# Patient Record
Sex: Female | Born: 2000 | Race: White | Hispanic: No | Smoking: Current every day smoker
Health system: Southern US, Community
[De-identification: ages and names within clinical notes are randomized; demographics above are authoritative.]

## PROBLEM LIST (undated history)

## (undated) DIAGNOSIS — J45909 Unspecified asthma, uncomplicated: Secondary | ICD-10-CM

## (undated) HISTORY — PX: OTHER SURGICAL HISTORY: SHX169

---
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
## 2022-02-19 ENCOUNTER — Telehealth: Payer: Self-pay | Admitting: Obstetrics and Gynecology

## 2022-02-19 DIAGNOSIS — Z3041 Encounter for surveillance of contraceptive pills: Secondary | ICD-10-CM

## 2022-02-20 ENCOUNTER — Other Ambulatory Visit: Payer: Self-pay

## 2022-02-20 NOTE — Telephone Encounter (Signed)
I contacted patient via phone and left message for patient to call back to confirm scheduled annual exam with Dr. Valentino Saxon.

## 2022-02-24 ENCOUNTER — Ambulatory Visit (INDEPENDENT_AMBULATORY_CARE_PROVIDER_SITE_OTHER): Payer: Medicaid Other | Admitting: Gastroenterology

## 2022-02-24 ENCOUNTER — Encounter: Payer: Self-pay | Admitting: Gastroenterology

## 2022-02-24 VITALS — BP 121/81 | HR 81 | Temp 98.9°F | Ht 65.07 in | Wt 118.0 lb

## 2022-02-24 DIAGNOSIS — K640 First degree hemorrhoids: Secondary | ICD-10-CM

## 2022-02-24 DIAGNOSIS — K644 Residual hemorrhoidal skin tags: Secondary | ICD-10-CM | POA: Diagnosis not present

## 2022-02-24 MED ORDER — NORGESTIMATE-ETH ESTRADIOL 0.25-35 MG-MCG PO TABS: 1.0000 | ORAL_TABLET | Freq: Every day | ORAL | 0 refills | Status: DC

## 2022-02-24 NOTE — Telephone Encounter (Signed)
Patient is scheduled for 04/08/22 with Dr. Valentino Saxon

## 2022-02-24 NOTE — Telephone Encounter (Signed)
Please advise refill? 

## 2022-02-24 NOTE — Addendum Note (Signed)
Addended by: Georgiana Shore R on: 02/24/2022 03:08 PM   Modules accepted: Orders

## 2022-02-24 NOTE — Progress Notes (Signed)
Arlyss Repress, MD [address]  [address]  [place], [place] [address]  Main: [phone]  Fax: [phone]    Gastroenterology Consultation  Referring Provider:     Pediatrics, [facility] Primary Care Physician:  Ardyth Gal, MD Primary Gastroenterologist:  Dr. Arlyss Repress Reason for Consultation: Symptomatic hemorrhoids and perianal skin tag        HPI:   Kristina Alexander is a 21 y.o. female referred by Dr. Ardyth Gal, MD  for consultation & management of symptomatic hemorrhoids and perianal skin tag.  Patient reports that approximately the last 3 to 4 years, she has been experiencing intermittent and symptoms including rectal pressure, swelling, discomfort, itching and pain.  She is mostly worried about perianal skin tag that has developed about 2 to 40 years ago when she had severe symptomatic external hemorrhoids which have brought her to the ER.  She was having severe constipation for about 2 weeks.  Since then, she has been very careful keeping her bowels regular.  She denies any constipation or diarrhea.  She denies any rectal bleeding.  She denies any other GI symptoms.  She does not smoke or drink alcohol  NSAIDs: None  Antiplts/Anticoagulants/Anti thrombotics: None  GI Procedures: None She denies family history of GI malignancy  Past Medical History:  Diagnosis Date   Asthma     Past Surgical History:  Procedure Laterality Date   no surgerical history      Current Outpatient Medications:    norgestimate-ethinyl estradiol (SPRINTEC 28) 0.25-35 MG-MCG tablet, Take 1 tablet by mouth daily., Disp: 84 tablet, Rfl: 0   sertraline (ZOLOFT) 25 MG tablet, Take 1 tablet by mouth daily., Disp: , Rfl:    Family History  Problem Relation Age of Onset   Meniere's disease Mother    Healthy Father    Healthy Maternal Grandmother      Social History   Tobacco Use   Smoking status: Never   Smokeless tobacco: Never  Vaping Use   Vaping Use: Every day    Substances: Nicotine  Substance Use Topics   Alcohol use: Yes    Comment: 2 glasses 2 to 3 times a week   Drug use: Yes    Types: Marijuana    Allergies as of 02/24/2022 - Review Complete 02/24/2022  Allergen Reaction Noted   Latex  12/18/2016    Review of Systems:    All systems reviewed and negative except where noted in HPI.   Physical Exam:  BP 121/81 (BP Location: Left Arm, Patient Position: Sitting, Cuff Size: Normal)   Pulse 81   Temp 98.9 F (37.2 C) (Oral)   Ht 5' 5.07" (1.653 m)   Wt 118 lb (53.5 kg)   BMI 19.59 kg/m  No LMP recorded. (Menstrual status: Oral contraceptives).  General:   Alert,  Well-developed, well-nourished, pleasant and cooperative in NAD Head:  Normocephalic and atraumatic. Eyes:  Sclera clear, no icterus.   Conjunctiva pink. Ears:  Normal auditory acuity. Nose:  No deformity, discharge, or lesions. Mouth:  No deformity or lesions,oropharynx pink & moist. Neck:  Supple; no masses or thyromegaly. Lungs:  Respirations even and unlabored.  Clear throughout to auscultation.   No wheezes, crackles, or rhonchi. No acute distress. Heart:  Regular rate and rhythm; no murmurs, clicks, rubs, or gallops. Abdomen:  Normal bowel sounds. Soft, non-tender and non-distended without masses, hepatosplenomegaly or hernias noted.  No guarding or rebound tenderness.   Rectal: Very small perianal skin tag Msk:  Symmetrical without gross deformities. Good, equal movement & strength bilaterally. Pulses:  Normal pulses noted. Extremities:  No clubbing or edema.  No cyanosis. Neurologic:  Alert and oriented x3;  grossly normal neurologically. Skin:  Intact without significant lesions or rashes. No jaundice. Psych:  Alert and cooperative. Normal mood and affect.  Imaging Studies: No abdominal imaging  Assessment and Plan:   Kristina Alexander is a 21 y.o. pleasant female with perianal skin tag and grade 1 symptomatic external hemorrhoids.  Perianal skin  tag Advised patient that I do not treat perianal skin tag and given the size, I do not recommend surgical removal  Grade 1 external hemorrhoids Discussed about outpatient hemorrhoid ligation, procedure, risks and benefits Information provided Patient deferred hemorrhoid ligation today Advised about over-the-counter hemorrhoidal remedies as needed Continue high-fiber diet with adequate intake of water and keep bowels regular   Follow up as needed   Cephas Darby, MD

## 2022-04-04 NOTE — Progress Notes (Signed)
GYNECOLOGY ANNUAL PHYSICAL EXAM PROGRESS NOTE  Subjective:    Kristina Alexander is a 22 y.o. G3P0010 female who presents for an annual exam.  The patient is sexually active. The patient participates in regular exercise: yes. Has the patient ever been transfused or tattooed?: yes. The patient reports that there is not domestic violence in her life.   The patient has the following complaints today:  Patient has complaints of small breasts bilaterally, right slightly smaller than left. Feels that something went wrong during her breast development.  Feels that the pectoral muscle did not develop, has no "lift".  Currently notes wearing an A cup which is still not fitting.    Menstrual History: Menarche age: 26 Patient's last menstrual period was 03/09/2022. Period Cycle (Days): 28 Period Duration (Days): 5-6 Period Pattern: Regular Menstrual Flow: Moderate Menstrual Control: Maxi pad, Tampon Menstrual Control Change Freq (Hours): 2-3 Dysmenorrhea: (!) Moderate Dysmenorrhea Symptoms: Cramping, Nausea   Gynecologic History:  Contraception: OCP (estrogen/progesterone) History of STI's: Denies     Upstream - 04/08/22 1059       Pregnancy Intention Screening   Does the patient want to become pregnant in the next year? No    Does the patient's partner want to become pregnant in the next year? No    Would the patient like to discuss contraceptive options today? No      Contraception Wrap Up   Current Method Oral Contraceptive    End Method Oral Contraceptive    How was the end contraceptive method provided? Prescription            The pregnancy intention screening data noted above was reviewed. Potential methods of contraception were discussed. The patient elected to proceed with Oral Contraceptive.   OB History  Gravida Para Term Preterm AB Living  1 0 0 0 1 0  SAB IAB Ectopic Multiple Live Births  0 1 0 0 0    # Outcome Date GA Lbr Len/2nd Weight Sex Delivery Anes  PTL Lv  1 IAB 2021            Past Medical History:  Diagnosis Date   Asthma     Past Surgical History:  Procedure Laterality Date   no surgerical history      Family History  Problem Relation Age of Onset   Meniere's disease Mother    Healthy Father    Healthy Maternal Grandmother     Social History   Socioeconomic History   Marital status: Single    Spouse name: Not on file   Number of children: Not on file   Years of education: Not on file   Highest education level: Not on file  Occupational History   Not on file  Tobacco Use   Smoking status: Never   Smokeless tobacco: Never  Vaping Use   Vaping Use: Every day   Substances: Nicotine  Substance and Sexual Activity   Alcohol use: Yes    Comment: 2 glasses 2 to 3 times a week   Drug use: Yes    Types: Marijuana   Sexual activity: Yes    Birth control/protection: Condom  Other Topics Concern   Not on file  Social History Narrative   ** Merged History Encounter **       Social Determinants of Health   Financial Resource Strain: Not on file  Food Insecurity: Not on file  Transportation Needs: Not on file  Physical Activity: Not on file  Stress: Not on  file  Social Connections: Not on file  Intimate Partner Violence: Not on file    Current Outpatient Medications on File Prior to Visit  Medication Sig Dispense Refill   norgestimate-ethinyl estradiol ([place] 28) 0.25-35 MG-MCG tablet Take 1 tablet by mouth daily. 84 tablet 0   sertraline (ZOLOFT) 25 MG tablet Take 1 tablet by mouth daily.     No current facility-administered medications on file prior to visit.    Allergies  Allergen Reactions   Latex      Review of Systems Constitutional: negative for chills, fatigue, fevers and sweats Eyes: negative for irritation, redness and visual disturbance Ears, nose, mouth, throat, and face: negative for hearing loss, nasal congestion, snoring and tinnitus Respiratory: negative for asthma, cough,  sputum Cardiovascular: negative for chest pain, dyspnea, exertional chest pressure/discomfort, irregular heart beat, palpitations and syncope Gastrointestinal: negative for abdominal pain, change in bowel habits, nausea and vomiting Genitourinary: negative for abnormal menstrual periods, genital lesions, sexual problems and vaginal discharge, dysuria and urinary incontinence Integument/breast: negative for breast lump, breast tenderness and nipple discharge Hematologic/lymphatic: negative for bleeding and easy bruising Musculoskeletal:negative for back pain and muscle weakness Neurological: negative for dizziness, headaches, vertigo and weakness Endocrine: negative for diabetic symptoms including polydipsia, polyuria and skin dryness Allergic/Immunologic: negative for hay fever and urticaria      Objective:  Blood pressure 127/76, pulse 100, resp. rate 16, height 5\' 6"  (1.676 m), weight 116 lb 12.8 oz (53 kg), last menstrual period 03/09/2022. Body mass index is 18.85 kg/m.  General Appearance:    Alert, cooperative, no distress, appears stated age  Head:    Normocephalic, without obvious abnormality, atraumatic  Eyes:    PERRL, conjunctiva/corneas clear, EOM's intact, both eyes  Ears:    Normal external ear canals, both ears  Nose:   Nares normal, septum midline, mucosa normal, no drainage or sinus tenderness  Throat:   Lips, mucosa, and tongue normal; teeth and gums normal  Neck:   Supple, symmetrical, trachea midline, no adenopathy; thyroid: no enlargement/tenderness/nodules; no carotid bruit or JVD  Back:     Symmetric, no curvature, ROM normal, no CVA tenderness  Lungs:     Clear to auscultation bilaterally, respirations unlabored  Chest Wall:    No tenderness or deformity   Heart:    Regular rate and rhythm, S1 and S2 normal, no murmur, rub or gallop  Breast Exam:    No tenderness, masses, or nipple abnormality  Abdomen:     Soft, non-tender, bowel sounds active all four quadrants,  no masses, no organomegaly.    Genitalia:    Pelvic:deferred today.  Not indicated.   Rectal:    Declined.   Extremities:   Extremities normal, atraumatic, no cyanosis or edema  Pulses:   2+ and symmetric all extremities  Skin:   Skin color, texture, turgor normal, no rashes or lesions  Lymph nodes:   Cervical, supraclavicular, and axillary nodes normal  Neurologic:   CNII-XII intact, normal strength, sensation and reflexes throughout   .  Labs:  Lab Results  Component Value Date   WBC 6.1 08/15/2020   HGB 12.3 08/15/2020   HCT 38.1 08/15/2020   MCV 91 08/15/2020   PLT 295 08/15/2020    Lab Results  Component Value Date   CREATININE 0.73 08/15/2020   BUN 6 08/15/2020   NA 144 08/15/2020   K 4.4 08/15/2020   CL 105 08/15/2020   CO2 23 08/15/2020    Lab Results  Component Value Date  ALT 8 08/15/2020   AST 16 08/15/2020   ALKPHOS 69 08/15/2020   BILITOT 0.3 08/15/2020    No results found for: "TSH"   Assessment:   1. Encounter for well woman exam with routine gynecological exam   2. Surveillance for birth control, oral contraceptives   3. Small breast      Plan:  Blood tests: None ordered. Had labs performed in [place].   Breast self exam technique reviewed and patient encouraged to perform self-exam monthly. Contraception: condoms and OCP (estrogen/progesterone).  Refill previously given.  Discussed healthy lifestyle modifications.  Pap smear  : Not age appropriate . COVID vaccination status: Declines Flu vaccine: declines Small breasts, desires consultation, will refer to Plastics. Discussed pectoral exercises to engage that may help to enhance breast tissue and muscle.  Discussed HPV vaccine, unsure if she received in childhood. Relocated from [place] in 2022.  Recently had STD screening in [place]. Reviewed in [place].  Follow up in 1 year for annual exam   Rubie Maid, MD [facility]

## 2022-04-08 ENCOUNTER — Ambulatory Visit (INDEPENDENT_AMBULATORY_CARE_PROVIDER_SITE_OTHER): Payer: Medicaid Other | Admitting: Obstetrics and Gynecology

## 2022-04-08 ENCOUNTER — Encounter: Payer: Self-pay | Admitting: Obstetrics and Gynecology

## 2022-04-08 VITALS — BP 127/76 | HR 100 | Resp 16 | Ht 66.0 in | Wt 116.8 lb

## 2022-04-08 DIAGNOSIS — Z1159 Encounter for screening for other viral diseases: Secondary | ICD-10-CM

## 2022-04-08 DIAGNOSIS — Z01419 Encounter for gynecological examination (general) (routine) without abnormal findings: Secondary | ICD-10-CM | POA: Diagnosis not present

## 2022-04-08 DIAGNOSIS — Z3041 Encounter for surveillance of contraceptive pills: Secondary | ICD-10-CM

## 2022-04-08 DIAGNOSIS — Z114 Encounter for screening for human immunodeficiency virus [HIV]: Secondary | ICD-10-CM

## 2022-04-08 DIAGNOSIS — N6489 Other specified disorders of breast: Secondary | ICD-10-CM

## 2022-04-08 DIAGNOSIS — Z113 Encounter for screening for infections with a predominantly sexual mode of transmission: Secondary | ICD-10-CM

## 2022-04-08 DIAGNOSIS — Z23 Encounter for immunization: Secondary | ICD-10-CM

## 2022-04-08 MED ORDER — NORGESTIMATE-ETH ESTRADIOL 0.25-35 MG-MCG PO TABS: 1.0000 | ORAL_TABLET | Freq: Every day | ORAL | 3 refills | Status: AC

## 2022-05-02 ENCOUNTER — Ambulatory Visit (INDEPENDENT_AMBULATORY_CARE_PROVIDER_SITE_OTHER): Payer: Medicaid Other

## 2022-05-02 VITALS — BP 129/79 | HR 99 | Ht 66.0 in | Wt 120.3 lb

## 2022-05-02 DIAGNOSIS — R399 Unspecified symptoms and signs involving the genitourinary system: Secondary | ICD-10-CM | POA: Diagnosis not present

## 2022-05-02 DIAGNOSIS — R350 Frequency of micturition: Secondary | ICD-10-CM | POA: Diagnosis not present

## 2022-05-02 DIAGNOSIS — R82998 Other abnormal findings in urine: Secondary | ICD-10-CM

## 2022-05-02 LAB — POCT URINALYSIS DIPSTICK
Bilirubin, UA: NEGATIVE
Glucose, UA: NEGATIVE
Ketones, UA: NEGATIVE
Nitrite, UA: NEGATIVE
Protein, UA: NEGATIVE
Spec Grav, UA: 1.01 (ref 1.010–1.025)
Urobilinogen, UA: 0.2 E.U./dL
pH, UA: 6 (ref 5.0–8.0)

## 2022-05-02 MED ORDER — NITROFURANTOIN MONOHYD MACRO 100 MG PO CAPS: 100.0000 mg | ORAL_CAPSULE | Freq: Two times a day (BID) | ORAL | 0 refills | Status: DC

## 2022-05-02 NOTE — Progress Notes (Signed)
    NURSE VISIT NOTE  Subjective:    Patient ID: Kristina Alexander, female    DOB: 01/02/2001, 22 y.o.   MRN: [id number]       HPI  Patient is a 22 y.o. G4P0010 female G4P0010 female who presents for dysuria, urinary frequency, urinary urgency, and throbbing  for 2 weeks.   Patient does have a history of recurrent UTI.  Patient does not have a history of pyelonephritis.    Objective:    Ht 5' 6"$  (1.676 m)   Wt 120 lb 4.8 oz (54.6 kg)   LMP 04/08/2022 (Approximate)   BMI 19.42 kg/m    Lab Review  Results for orders placed or performed in visit on 05/02/22  POCT urinalysis dipstick  Result Value Ref Range   Color, UA yellow    Clarity, UA clear    Glucose, UA Negative Negative   Bilirubin, UA neg    Ketones, UA neg    Spec Grav, UA 1.010 1.010 - 1.025   Blood, UA small    pH, UA 6.0 5.0 - 8.0   Protein, UA Negative Negative   Urobilinogen, UA 0.2 0.2 or 1.0 E.U./dL   Nitrite, UA neg    Leukocytes, UA Large (3+) (A) Negative   Appearance     Odor      Assessment:   1. UTI symptoms   2. Leukocytes in urine   3. Urinary frequency      Plan:   Urine Culture Sent. Treatment  Macrobid 100 mg PO BID for 7 days. Maintain adequate hydration.  May use AZO OTC prn.  Follow up if symptoms worsen or fail to improve as anticipated, and as needed.    Marykay Lex, CMA

## 2022-05-07 ENCOUNTER — Institutional Professional Consult (permissible substitution): Payer: Medicaid Other | Admitting: Plastic Surgery

## 2022-05-07 LAB — URINE CULTURE

## 2022-05-14 ENCOUNTER — Ambulatory Visit (INDEPENDENT_AMBULATORY_CARE_PROVIDER_SITE_OTHER): Payer: Self-pay | Admitting: Plastic Surgery

## 2022-05-14 ENCOUNTER — Encounter: Payer: Self-pay | Admitting: Plastic Surgery

## 2022-05-14 VITALS — BP 129/76 | HR 90 | Ht 65.0 in | Wt 122.2 lb

## 2022-05-14 DIAGNOSIS — N6489 Other specified disorders of breast: Secondary | ICD-10-CM

## 2022-05-14 NOTE — Progress Notes (Signed)
   Referring Provider Rubie Maid, [place],  [place] [address]   CC:  Chief Complaint  Patient presents with   Advice Only      Kristina Alexander is an 22 y.o. female.  HPI: Kristina Alexander is a 22 year old female who presents today for evaluation of small breast.  The patient states that she had very little breast development at the beginning of puberty and that it never quite continued.  She she states that this is very depressing to her and that she often has people make unkind comments regarding her breast development.  She was wondering if she has a congenital defect which might be treated by breast augmentation.  Allergies  Allergen Reactions   Latex     Outpatient Encounter Medications as of 05/14/2022  Medication Sig   norgestimate-ethinyl estradiol (SPRINTEC 28) 0.25-35 MG-MCG tablet Take 1 tablet by mouth daily.   sertraline (ZOLOFT) 50 MG tablet Take 1 tablet by mouth daily.   [DISCONTINUED] nitrofurantoin, macrocrystal-monohydrate, (MACROBID) 100 MG capsule Take 1 capsule (100 mg total) by mouth 2 (two) times daily. (Patient not taking: Reported on 05/14/2022)   No facility-administered encounter medications on file as of 05/14/2022.     Past Medical History:  Diagnosis Date   Asthma     Past Surgical History:  Procedure Laterality Date   no surgerical history      Family History  Problem Relation Age of Onset   Meniere's disease Mother    Healthy Father    Healthy Maternal Grandmother     Social History   Social History Narrative   ** Merged History Encounter **         Review of Systems General: Denies fevers, chills, weight loss CV: Denies chest pain, shortness of breath, palpitations Breast: Bilateral small breast  Physical Exam    05/14/2022    3:29 PM 05/02/2022    2:40 PM 04/08/2022   10:57 AM  Vitals with BMI  Height 5' 5"$  5' 6"$  5' 6"$   Weight 122 lbs 3 oz 120 lbs 5 oz 116 lbs 13 oz  BMI 20.34 0000000 [id number]  Systolic [id number]  [id number]  Diastolic 76 79 76  Pulse 90 99 100    General:  No acute distress,  Alert and oriented, Non-Toxic, Normal speech and affect Upper extremities: Patient has normal appearing upper extremities with normal fingers and normal grip strength bilaterally Chest wall: The patient has normal-appearing pectoralis muscles bilaterally with equal contraction bilaterally Breast: The patient has very small breasts bilaterally however she has normal breast tissue with no obvious abnormalities there is no tuberous component to either breast. Mammogram: Not applicable Assessment/Plan Breasts: The patient has small breast bilaterally.  She would likely obtain a very nice result with bilateral breast augmentation however she has no congenital component to this that I can ascertain on physical exam.  Any augmentation would be an aesthetic procedure.  Will provide her with a quote for bilateral breast augmentation with both silicone and saline implants.  We did discuss the fact that if she was to choose to move forward that she would not be a candidate for breast augmentation while she was smoking.  She may follow-up with me as needed.  [name] 05/14/2022, 3:45 PM

## 2022-05-15 ENCOUNTER — Encounter: Payer: Self-pay | Admitting: *Deleted

## 2023-05-14 ENCOUNTER — Other Ambulatory Visit: Payer: Self-pay | Admitting: Obstetrics and Gynecology

## 2023-05-14 DIAGNOSIS — Z3041 Encounter for surveillance of contraceptive pills: Secondary | ICD-10-CM

## 2023-09-02 ENCOUNTER — Emergency Department
Admission: EM | Admit: 2023-09-02 | Discharge: 2023-09-02 | Disposition: A | Discharge status: Home / Self Care | Attending: Emergency Medicine | Admitting: Emergency Medicine

## 2023-09-02 ENCOUNTER — Encounter: Payer: Self-pay | Admitting: Emergency Medicine

## 2023-09-02 DIAGNOSIS — J45909 Unspecified asthma, uncomplicated: Secondary | ICD-10-CM | POA: Diagnosis not present

## 2023-09-02 DIAGNOSIS — Z23 Encounter for immunization: Secondary | ICD-10-CM | POA: Insufficient documentation

## 2023-09-02 DIAGNOSIS — S41111A Laceration without foreign body of right upper arm, initial encounter: Secondary | ICD-10-CM

## 2023-09-02 DIAGNOSIS — W228XXA Striking against or struck by other objects, initial encounter: Secondary | ICD-10-CM | POA: Diagnosis not present

## 2023-09-02 DIAGNOSIS — S61411A Laceration without foreign body of right hand, initial encounter: Secondary | ICD-10-CM | POA: Diagnosis not present

## 2023-09-02 DIAGNOSIS — S51811A Laceration without foreign body of right forearm, initial encounter: Secondary | ICD-10-CM | POA: Insufficient documentation

## 2023-09-02 DIAGNOSIS — S6991XA Unspecified injury of right wrist, hand and finger(s), initial encounter: Secondary | ICD-10-CM | POA: Diagnosis present

## 2023-09-02 MED ORDER — LIDOCAINE-EPINEPHRINE (PF) 2 %-1:200000 IJ SOLN
20.0000 mL | Freq: Once | INTRAMUSCULAR | Status: AC
  Administered 2023-09-02: 20 mL via INTRADERMAL
  Filled 2023-09-02: qty 20

## 2023-09-02 MED ORDER — BACITRACIN ZINC 500 UNIT/GM EX OINT
TOPICAL_OINTMENT | Freq: Once | CUTANEOUS | Status: AC
  Filled 2023-09-02: qty 0.9

## 2023-09-02 MED ORDER — TETANUS-DIPHTH-ACELL PERTUSSIS 5-2.5-18.5 LF-MCG/0.5 IM SUSY
0.5000 mL | PREFILLED_SYRINGE | Freq: Once | INTRAMUSCULAR | Status: AC
  Administered 2023-09-02: 0.5 mL via INTRAMUSCULAR
  Filled 2023-09-02: qty 0.5

## 2023-09-02 NOTE — ED Triage Notes (Signed)
 Pt punched a window approx 1 hour prior to arrival, resulting in an approx 1 inch laceration to the right forearm. Abrasions noted to the top of right hand and on 5th digit. Bleeding controlled, no glass present. Wounds cleaned and new dry bandage applied.

## 2023-09-02 NOTE — Discharge Instructions (Addendum)
 You may alternate Tylenol 1000 mg every 6 hours as needed for pain, fever and Ibuprofen 800 mg every 6-8 hours as needed for pain, fever.  Please take Ibuprofen with food.  Do not take more than 4000 mg of Tylenol (acetaminophen) in a 24 hour period.  You may clean your wound gently daily with soap and warm water and then pat dry.  Your sutures are absorbable and will come out on their own in the next 2 to 3 weeks.  If they have not come out by the end of 3 weeks, you may return to the emergency department, urgent care or your PCP for removal.  Please do not apply over-the-counter antibiotic ointment as this can breakdown absorbable sutures too quickly.  Also many people have reactions to these ointments and can delay wound healing rather than help it.  Please keep your wound clean and dry.  You may leave it uncovered or cover it with a dressing daily.  All wounds have the risk of becoming infected.  We do everything sterilely to try to prevent this and clean the wound with saline, Betadine or chlorhexidine.  If your wound becomes red, warm, draining pus, has a foul odor or you have a fever of 100.4 or higher, please return to the emergency department.  All wounds also have the risk of scarring.  I recommend once your wound has completely healed and your sutures have come out or have been removed that you make sure to keep sunscreen on your scars every day for at least the next year.  You may also use over-the-counter Mederma once the wound is healed which can help with scar formation.

## 2023-09-02 NOTE — ED Provider Notes (Addendum)
 [facility] Provider Note    Event Date/Time   First MD Initiated Contact with Patient 09/02/23 0304     (approximate)   History   Laceration   HPI  Kristina Alexander is a 23 y.o. female right-hand-dominant with history of asthma, alcohol use disorder, depression, anorexia nervosa who presents to the emergency department with lacerations to the right dorsal hand and forearm after she got angry and punched a small window tonight.  She denies any bony pain.  Normal range of motion in her fingers, wrist, elbow.  Denies that this was an attempt to harm herself.  Unsure of her last tetanus vaccine.  Denies any other injury.  No numbness, tingling.   History provided by patient.    Past Medical History:  Diagnosis Date   Asthma     Past Surgical History:  Procedure Laterality Date   no surgerical history      MEDICATIONS:  Prior to Admission medications   Medication Sig Start Date End Date Taking? Authorizing Provider  norgestimate -ethinyl estradiol  (SPRINTEC 28) 0.25-35 MG-MCG tablet Take 1 tablet by mouth daily. 04/08/22   Teresa Fender, MD  sertraline (ZOLOFT) 50 MG tablet Take 1 tablet by mouth daily. 03/27/22 03/27/23  [provider]    Physical Exam   Triage Vital Signs: ED Triage Vitals  Encounter Vitals Group     BP 09/02/23 0226 (!) 138/98     Girls Systolic BP Percentile --      Girls Diastolic BP Percentile --      Boys Systolic BP Percentile --      Boys Diastolic BP Percentile --      Pulse Rate 09/02/23 0226 (!) 113     Resp 09/02/23 0226 16     Temp 09/02/23 0226 97.8 F (36.6 C)     Temp Source 09/02/23 0226 Oral     SpO2 09/02/23 0226 98 %     Weight 09/02/23 0226 110 lb (49.9 kg)     Height 09/02/23 0226 5' 6 (1.676 m)     Head Circumference --      Peak Flow --      Pain Score 09/02/23 0232 3     Pain Loc --      Pain Education --      Exclude from Growth Chart --     Most recent vital signs: Vitals:    09/02/23 0226 09/02/23 0427  BP: (!) 138/98 115/84  Pulse: (!) 113 66  Resp: 16 16  Temp: 97.8 F (36.6 C) 97.9 F (36.6 C)  SpO2: 98% 96%     CONSTITUTIONAL: Alert and responds appropriately to questions. Well-appearing; well-nourished, appears anxious HEAD: Normocephalic, atraumatic EYES: Conjunctivae clear, pupils appear equal ENT: normal nose; moist mucous membranes NECK: Normal range of motion CARD: Regular rate and rhythm RESP: Normal chest excursion without splinting or tachypnea; no hypoxia or respiratory distress, speaking full sentences ABD/GI: non-distended EXT: Normal ROM in all joints, no major deformities noted, 5 mm laceration to the dorsal right hand, 3 cm laceration to the proximal right forearm.  No signs of retained foreign body, tendon involvement.  No bony tenderness.  No significant soft tissue swelling but does have some mild bruising.  Other superficial abrasions noted around the wrist and hand.  Normal capillary refill.  2+ right radial pulse.  Compartments throughout the arm are soft.  No joint effusion and normal range of motion in all joints of the right upper extremity. SKIN: Normal color  for age and race, no rashes on exposed skin NEURO: Moves all extremities equally, normal speech, no facial asymmetry noted PSYCH: The patient's mood and manner are appropriate. Grooming and personal hygiene are appropriate.  ED Results / Procedures / Treatments   LABS: (all labs ordered are listed, but only abnormal results are displayed) Labs Reviewed - No data to display   EKG:   RADIOLOGY: My personal review and interpretation of imaging:    I have personally reviewed all radiology reports. No results found.   PROCEDURES:  Critical Care performed: No   LACERATION REPAIR Performed by: Verneda Golder Authorized by: Verneda Golder Consent: Verbal consent obtained. Risks and benefits: risks, benefits and alternatives were discussed Consent given by:  patient Patient identity confirmed: provided demographic data Prepped and Draped in normal sterile fashion Wound explored  Laceration Location: right hand, right forearm  Laceration Length: 0.5 cm, 3 cm  No Foreign Bodies seen or palpated  Anesthesia: local infiltration  Local anesthetic: lidocaine  2% with epinephrine  Anesthetic total: 5 ml total  Irrigation method: syringe Amount of cleaning: standard  Skin closure: superficial  Number of sutures: 1; 4  Technique: Area anesthetized using lidocaine  2% with epinephrine. Wound irrigated copiously with sterile saline. Wound then cleaned with Betadine and draped in sterile fashion. Wound closed using a total of 5 simple interrupted sutures with 5-0 Vicryl.  Bacitracin and sterile dressing applied. Good wound approximation and hemostasis achieved.    Patient tolerance: Patient tolerated the procedure well with no immediate complications.   Procedures    IMPRESSION / MDM / ASSESSMENT AND PLAN / ED COURSE  I reviewed the triage vital signs and the nursing notes.   Patient here with superficial lacerations to the right hand, forearm.     DIFFERENTIAL DIAGNOSIS (includes but not limited to):   Soft tissue laceration.  No sign of tendon involvement.  Low suspicion for fracture.  No sign of compartment syndrome.  No sign of superimposed infection.  Patient's presentation is most consistent with acute complicated illness / injury requiring diagnostic workup.  PLAN: Will clean and repair wounds.  Will update her tetanus vaccine.  She denies that this was an attempt to harm herself.  No bony tenderness on exam.  Patient declines x-rays.  No sign of retained foreign body.  Neurovascularly intact distally.   MEDICATIONS GIVEN IN ED: Medications  Tdap (BOOSTRIX) injection 0.5 mL (0.5 mLs Intramuscular Given 09/02/23 0320)  lidocaine -EPINEPHrine (XYLOCAINE  W/EPI) 2 %-1:200000 (PF) injection 20 mL (20 mLs Intradermal Given by Other  09/02/23 0400)  bacitracin ointment ( Topical Given by Other 09/02/23 0400)     ED COURSE: Patient tolerated repair well.  Discussed wound care instructions, return precautions.  Will discharge.  I do not feel patient needs emergent psychiatric evaluation today.  She is calm, cooperative.  Denies that this was an attempt at self-harm.   At this time, I do not feel there is any life-threatening condition present. I reviewed all nursing notes, vitals, pertinent previous records.  All lab and urine results, EKGs, imaging ordered have been independently reviewed and interpreted by myself.  I reviewed all available radiology reports from any imaging ordered this visit.  Based on my assessment, I feel the patient is safe to be discharged home without further emergent workup and can continue workup as an outpatient as needed. Discussed all findings, treatment plan as well as usual and customary return precautions.  They verbalize understanding and are comfortable with this plan.  Outpatient follow-up  has been provided as needed.  All questions have been answered.    CONSULTS:  none   OUTSIDE RECORDS REVIEWED: Reviewed last family medicine note on 08/05/2023.     FINAL CLINICAL IMPRESSION(S) / ED DIAGNOSES   Final diagnoses:  Laceration of multiple sites of right upper extremity, initial encounter     Rx / DC Orders   ED Discharge Orders     None        Note:  This document was prepared using Dragon voice recognition software and may include unintentional dictation errors.   Newell Frater, Clover Dao, DO 09/02/23 0444    Jamea Robicheaux, Clover Dao, DO 09/02/23 [phone]

## 2023-09-02 NOTE — ED Notes (Signed)
 Wound closure dressed w/ xeroform and gauze bandage.
